# Patient Record
Sex: Male | Born: 2000 | Hispanic: Yes | Marital: Single
Health system: Southern US, Community
[De-identification: ages and names within clinical notes are randomized; demographics above are authoritative.]

---

## 2021-06-07 ENCOUNTER — Emergency Department (HOSPITAL_COMMUNITY): Payer: No Typology Code available for payment source | Admitting: Registered Nurse

## 2021-06-07 ENCOUNTER — Encounter (HOSPITAL_COMMUNITY)
Admission: EM | Disposition: A | Discharge status: Home / Self Care | Payer: Self-pay | Source: Home / Self Care | Attending: Student

## 2021-06-07 ENCOUNTER — Ambulatory Visit (HOSPITAL_COMMUNITY)
Admission: EM | Admit: 2021-06-07 | Discharge: 2021-06-08 | Disposition: A | Discharge status: Home / Self Care | Payer: No Typology Code available for payment source | Attending: Student | Admitting: Student

## 2021-06-07 ENCOUNTER — Emergency Department (HOSPITAL_COMMUNITY): Payer: No Typology Code available for payment source

## 2021-06-07 ENCOUNTER — Encounter (HOSPITAL_COMMUNITY): Payer: Self-pay | Admitting: Registered Nurse

## 2021-06-07 DIAGNOSIS — Z23 Encounter for immunization: Secondary | ICD-10-CM | POA: Diagnosis not present

## 2021-06-07 DIAGNOSIS — S68123A Partial traumatic metacarpophalangeal amputation of left middle finger, initial encounter: Secondary | ICD-10-CM | POA: Insufficient documentation

## 2021-06-07 DIAGNOSIS — S68125A Partial traumatic metacarpophalangeal amputation of left ring finger, initial encounter: Secondary | ICD-10-CM | POA: Insufficient documentation

## 2021-06-07 DIAGNOSIS — S61211A Laceration without foreign body of left index finger without damage to nail, initial encounter: Secondary | ICD-10-CM

## 2021-06-07 DIAGNOSIS — W319XXA Contact with unspecified machinery, initial encounter: Secondary | ICD-10-CM | POA: Insufficient documentation

## 2021-06-07 DIAGNOSIS — S68113A Complete traumatic metacarpophalangeal amputation of left middle finger, initial encounter: Secondary | ICD-10-CM

## 2021-06-07 DIAGNOSIS — Y9289 Other specified places as the place of occurrence of the external cause: Secondary | ICD-10-CM | POA: Diagnosis not present

## 2021-06-07 HISTORY — PX: AMPUTATION: SHX166

## 2021-06-07 SURGERY — AMPUTATION DIGIT
Anesthesia: General | Laterality: Left

## 2021-06-07 MED ORDER — ACETAMINOPHEN 500 MG PO TABS
ORAL_TABLET | ORAL | Status: AC
  Filled 2021-06-07: qty 2

## 2021-06-07 MED ORDER — PROMETHAZINE HCL 25 MG/ML IJ SOLN: 6.2500 mg | INTRAMUSCULAR | Status: DC | PRN

## 2021-06-07 MED ORDER — DEXAMETHASONE SODIUM PHOSPHATE 10 MG/ML IJ SOLN
INTRAMUSCULAR | Status: AC
  Filled 2021-06-07: qty 1

## 2021-06-07 MED ORDER — SODIUM CHLORIDE 0.9 % IR SOLN
Status: DC | PRN
  Administered 2021-06-07: 1000 mL
  Administered 2021-06-08: 3000 mL

## 2021-06-07 MED ORDER — LACTATED RINGERS IV SOLN: INTRAVENOUS | Status: DC | PRN

## 2021-06-07 MED ORDER — LIDOCAINE 2% (20 MG/ML) 5 ML SYRINGE
INTRAMUSCULAR | Status: DC | PRN
  Administered 2021-06-07: 40 mg via INTRAVENOUS

## 2021-06-07 MED ORDER — MIDAZOLAM HCL 5 MG/5ML IJ SOLN
INTRAMUSCULAR | Status: DC | PRN
  Administered 2021-06-07: 2 mg via INTRAVENOUS

## 2021-06-07 MED ORDER — PROPOFOL 10 MG/ML IV BOLUS
INTRAVENOUS | Status: DC | PRN
  Administered 2021-06-07: 200 mg via INTRAVENOUS

## 2021-06-07 MED ORDER — MIDAZOLAM HCL 2 MG/2ML IJ SOLN
INTRAMUSCULAR | Status: AC
  Filled 2021-06-07: qty 2

## 2021-06-07 MED ORDER — HYDROCODONE-ACETAMINOPHEN 5-325 MG PO TABS
1.0000 | ORAL_TABLET | Freq: Once | ORAL | Status: AC
  Administered 2021-06-07: 1 via ORAL
  Filled 2021-06-07: qty 1

## 2021-06-07 MED ORDER — DEXAMETHASONE SODIUM PHOSPHATE 10 MG/ML IJ SOLN
INTRAMUSCULAR | Status: DC | PRN
  Administered 2021-06-07: 5 mg via INTRAVENOUS

## 2021-06-07 MED ORDER — ACETAMINOPHEN 500 MG PO TABS
1000.0000 mg | ORAL_TABLET | Freq: Once | ORAL | Status: AC
  Administered 2021-06-07: 1000 mg via ORAL

## 2021-06-07 MED ORDER — OXYCODONE HCL 5 MG PO TABS: 5.0000 mg | ORAL_TABLET | Freq: Once | ORAL | Status: DC | PRN

## 2021-06-07 MED ORDER — FENTANYL CITRATE (PF) 100 MCG/2ML IJ SOLN
INTRAMUSCULAR | Status: AC
  Filled 2021-06-07: qty 2

## 2021-06-07 MED ORDER — OXYCODONE HCL 5 MG/5ML PO SOLN: 5.0000 mg | Freq: Once | ORAL | Status: DC | PRN

## 2021-06-07 MED ORDER — ONDANSETRON HCL 4 MG/2ML IJ SOLN
INTRAMUSCULAR | Status: AC
  Filled 2021-06-07: qty 2

## 2021-06-07 MED ORDER — FENTANYL CITRATE (PF) 100 MCG/2ML IJ SOLN
INTRAMUSCULAR | Status: DC | PRN
  Administered 2021-06-07 – 2021-06-08 (×2): 50 ug via INTRAVENOUS

## 2021-06-07 MED ORDER — BUPIVACAINE HCL 0.25 % IJ SOLN
INTRAMUSCULAR | Status: AC
  Filled 2021-06-07: qty 1

## 2021-06-07 MED ORDER — TETANUS-DIPHTHERIA TOXOIDS TD 5-2 LFU IM INJ
0.5000 mL | INJECTION | Freq: Once | INTRAMUSCULAR | Status: AC
  Administered 2021-06-07: 0.5 mL via INTRAMUSCULAR
  Filled 2021-06-07: qty 0.5

## 2021-06-07 MED ORDER — CEFAZOLIN SODIUM-DEXTROSE 2-3 GM-%(50ML) IV SOLR
INTRAVENOUS | Status: DC | PRN
  Administered 2021-06-07: 2 g via INTRAVENOUS

## 2021-06-07 MED ORDER — CEFAZOLIN SODIUM-DEXTROSE 2-4 GM/100ML-% IV SOLN
INTRAVENOUS | Status: AC
  Filled 2021-06-07: qty 100

## 2021-06-07 MED ORDER — FENTANYL CITRATE PF 50 MCG/ML IJ SOSY: 25.0000 ug | PREFILLED_SYRINGE | INTRAMUSCULAR | Status: DC | PRN

## 2021-06-07 MED ORDER — PROPOFOL 10 MG/ML IV BOLUS
INTRAVENOUS | Status: AC
  Filled 2021-06-07: qty 20

## 2021-06-07 SURGICAL SUPPLY — 24 items
BAG COUNTER SPONGE SURGICOUNT (BAG) IMPLANT
BNDG ELASTIC 3X5.8 VLCR STR LF (GAUZE/BANDAGES/DRESSINGS) ×1 IMPLANT
BNDG ESMARK 4X9 LF (GAUZE/BANDAGES/DRESSINGS) ×1 IMPLANT
BNDG GAUZE ELAST 4 BULKY (GAUZE/BANDAGES/DRESSINGS) ×1 IMPLANT
COVER SURGICAL LIGHT HANDLE (MISCELLANEOUS) ×2 IMPLANT
CUFF TOURN SGL QUICK 18 (TOURNIQUET CUFF) ×1 IMPLANT
DECANTER SPIKE VIAL GLASS SM (MISCELLANEOUS) ×1 IMPLANT
DRAPE U-SHAPE 47X51 STRL (DRAPES) ×1 IMPLANT
GAUZE SPONGE 4X4 12PLY STRL (GAUZE/BANDAGES/DRESSINGS) ×1 IMPLANT
GAUZE XEROFORM 1X8 LF (GAUZE/BANDAGES/DRESSINGS) ×1 IMPLANT
GLOVE SURG ORTHO LTX SZ8 (GLOVE) ×2 IMPLANT
GOWN SPEC L3 XXLG W/TWL (GOWN DISPOSABLE) ×3 IMPLANT
KIT BASIN OR (CUSTOM PROCEDURE TRAY) ×1 IMPLANT
MANIFOLD NEPTUNE II (INSTRUMENTS) ×2 IMPLANT
PACK ORTHO EXTREMITY (CUSTOM PROCEDURE TRAY) ×1 IMPLANT
PAD CAST 4YDX4 CTTN HI CHSV (CAST SUPPLIES) IMPLANT
PADDING CAST COTTON 4X4 STRL (CAST SUPPLIES) ×1
PENCIL SMOKE EVACUATOR (MISCELLANEOUS) IMPLANT
SPLINT PLASTER EXTRA FAST 3X15 (CAST SUPPLIES) ×1
SPLINT PLASTER GYPS XFAST 3X15 (CAST SUPPLIES) IMPLANT
SUT CHROMIC 5 0 P 3 (SUTURE) ×1 IMPLANT
SUT CHROMIC 7 0 TG140 8 (SUTURE) ×1 IMPLANT
SUT MON AB 5-0 PS2 18 (SUTURE) ×1 IMPLANT
SYR CONTROL 10ML LL (SYRINGE) ×1 IMPLANT

## 2021-06-07 NOTE — Progress Notes (Signed)
Using AMN interpreting service : Imelda Pillow # [phone]

## 2021-06-07 NOTE — ED Notes (Signed)
X-ray at bedside

## 2021-06-07 NOTE — ED Provider Notes (Signed)
[facility]-EMERGENCY DEPT Provider Note  CSN: [id number] Arrival date & time: 06/07/21 1747  Chief Complaint(s) Laceration  HPI Gary Pittman is a 21 y.o. male who presents emergency department for evaluation of a left hand injury.  Patient was using a paper grinder with rotating metal blades and got his left hand stuck in the machine causing a traumatic amputation of digits 2 and 3 on the left.  He arrives with active oozing but no arterial bleeding.  Denies numbness, tingling, weakness of the hands.  Range of motion limited by pain.  Pulses intact.  Unsure of tetanus vaccination status.   Laceration  Past Medical History No past medical history on file. There are no problems to display for this patient.  Home Medication(s) Prior to Admission medications   Not on File                                                                                                                                    Past Surgical History  Family History No family history on file.  Social History   Allergies Patient has no known allergies.  Review of Systems Review of Systems  Skin:  Positive for wound.   Physical Exam Vital Signs  I have reviewed the triage vital signs BP 131/72    Pulse 73    Temp 98 F (36.7 C)    Resp 18    Wt 60 kg    SpO2 100%   Physical Exam Vitals and nursing note reviewed.  Constitutional:      General: He is not in acute distress.    Appearance: He is well-developed.  HENT:     Head: Normocephalic and atraumatic.  Eyes:     Conjunctiva/sclera: Conjunctivae normal.  Cardiovascular:     Rate and Rhythm: Normal rate and regular rhythm.     Heart sounds: No murmur heard. Pulmonary:     Effort: Pulmonary effort is normal. No respiratory distress.     Breath sounds: Normal breath sounds.  Abdominal:     Palpations: Abdomen is soft.     Tenderness: There is no abdominal tenderness.  Musculoskeletal:        General: Tenderness,  deformity and signs of injury present. No swelling.     Cervical back: Neck supple.  Skin:    General: Skin is warm and dry.     Capillary Refill: Capillary refill takes less than 2 seconds.  Neurological:     Mental Status: He is alert.  Psychiatric:        Mood and Affect: Mood normal.      ED Results and Treatments Labs (all labs ordered are listed, but only abnormal results are displayed) Labs Reviewed - No data to display  Radiology DG Hand Complete Left  Result Date: 06/07/2021 CLINICAL DATA:  Laceration EXAM: LEFT HAND - COMPLETE 3+ VIEW COMPARISON:  None. FINDINGS: Soft tissue and bony amputation of the third digit involving the tuft of the distal phalanx. No radiopaque foreign body IMPRESSION: Soft tissue and bony amputation of the third distal digit involves the tuft of the distal phalanx Electronically Signed   By: Jasmine Pang M.D.   On: 06/07/2021 18:28    Pertinent labs & imaging results that were available during my care of the patient were reviewed by me and considered in my medical decision making (see MDM for details).  Medications Ordered in ED Medications  HYDROcodone-acetaminophen (NORCO/VICODIN) 5-325 MG per tablet 1 tablet (1 tablet Oral Given 06/07/21 1803)  tetanus & diphtheria toxoids (adult) (TENIVAC) injection 0.5 mL (0.5 mLs Intramuscular Given 06/07/21 1915)                                                                                                                                     Procedures Procedures  (including critical care time)  Medical Decision Making / ED Course   This patient presents to the ED for concern of finger injury, this involves an extensive number of treatment options, and is a complaint that carries with it a high risk of complications and morbidity.  The differential diagnosis includes fracture,  laceration, traumatic amputation   MDM: Patient seen the emergency department for evaluation of a finger injury.  Physical exam reveals traumatic amputation of digits 2 and 3 distal tip around the tuft on the left hand.  Bleeding controlled with finger tourniquets and ultimately with quick clot and compressive bandages.  X-rays showing amputation of the distal tuft.  Hand surgery was consulted who will take the patient to the operating room tonight for repair.  Tetanus updated here in the emergency department.   Additional history obtained: -Additional history obtained from work Merchandiser, retail -External records from outside source obtained and reviewed including: Chart review including previous notes, labs, imaging, consultation notes   Lab Tests: -I ordered, reviewed, and interpreted labs.   The pertinent results include:   Labs Reviewed - No data to display     Imaging Studies ordered: I ordered imaging studies including XR hand I independently visualized and interpreted imaging. I agree with the radiologist interpretation   Medicines ordered and prescription drug management: Meds ordered this encounter  Medications   HYDROcodone-acetaminophen (NORCO/VICODIN) 5-325 MG per tablet 1 tablet   tetanus & diphtheria toxoids (adult) (TENIVAC) injection 0.5 mL    -I have reviewed the patients home medicines and have made adjustments as needed  Critical interventions none  Consultations Obtained: I requested consultation with the hand surgery,  and discussed lab and imaging findings as well as pertinent plan - they recommend: NPO, take to OR tonight   Cardiac Monitoring: The patient was maintained on a cardiac monitor.  I personally viewed and  interpreted the cardiac monitored which showed an underlying rhythm of: NSR  Social Determinants of Health:  Factors impacting patients care include: non-english speaking   Reevaluation: After the interventions noted above, I reevaluated the  patient and found that they have :improved  Co morbidities that complicate the patient evaluation No past medical history on file.    Dispostion: I considered admission for this patient, and he will go straight from the emergency department to the operating room for operative repair with hand surgery     Final Clinical Impression(s) / ED Diagnoses Final diagnoses:  Laceration of left index finger without foreign body, nail damage status unspecified, initial encounter     @PCDICTATION @    Glendora ScoreKommor, Odette Watanabe, MD 06/07/21 1955

## 2021-06-07 NOTE — ED Triage Notes (Signed)
Pt was at work and cut 2 fingers on left hand with a "chopper" machine. Tetanus not up to date. Pt bleeding but controlled

## 2021-06-07 NOTE — Progress Notes (Signed)
1 g tylenol po given with sips of water.

## 2021-06-07 NOTE — Discharge Instructions (Addendum)

## 2021-06-07 NOTE — Progress Notes (Signed)
D; pt tx to PACU for pre-op via wheelchair. No IV site. Family are at waiting area per ER staff.

## 2021-06-07 NOTE — ED Notes (Signed)
MD at bedside during triage, pt unwrapped bandage. Injury to left right and left middle finger. Bleeding to tips of both fingers. Md applied finger tourniquets to both to help control bleeding. Wet gauze applied. Pain medication given. MD advised pt that hand surgeon may be needed due to extent of injury.

## 2021-06-07 NOTE — Anesthesia Preprocedure Evaluation (Addendum)
Anesthesia Evaluation  Patient identified by MRN, date of birth, ID band Patient awake    Reviewed: Allergy & Precautions, NPO status , Patient's Chart, lab work & pertinent test results  History of Anesthesia Complications Negative for: history of anesthetic complications  Airway Mallampati: I  TM Distance: >3 FB Neck ROM: Full    Dental  (+) Dental Advisory Given, Chipped   Pulmonary neg pulmonary ROS,    Pulmonary exam normal        Cardiovascular negative cardio ROS Normal cardiovascular exam     Neuro/Psych negative neurological ROS  negative psych ROS   GI/Hepatic negative GI ROS, Neg liver ROS,   Endo/Other  negative endocrine ROS  Renal/GU negative Renal ROS     Musculoskeletal negative musculoskeletal ROS (+)   Abdominal   Peds  Hematology negative hematology ROS (+)   Anesthesia Other Findings   Reproductive/Obstetrics                            Anesthesia Physical Anesthesia Plan  ASA: 1  Anesthesia Plan: General   Post-op Pain Management: Regional block and Minimal or no pain anticipated   Induction: Intravenous  PONV Risk Score and Plan: 2 and Treatment may vary due to age or medical condition, Ondansetron, Dexamethasone and Midazolam  Airway Management Planned: LMA  Additional Equipment: None  Intra-op Plan:   Post-operative Plan: Extubation in OR  Informed Consent: I have reviewed the patients History and Physical, chart, labs and discussed the procedure including the risks, benefits and alternatives for the proposed anesthesia with the patient or authorized representative who has indicated his/her understanding and acceptance.     Dental advisory given and Interpreter used for interveiw  Plan Discussed with: CRNA and Anesthesiologist  Anesthesia Plan Comments:        Anesthesia Quick Evaluation

## 2021-06-07 NOTE — H&P (Signed)
Gary Pittman is an 21 y.o. male.   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  He is not able to describe the machine but states it has a blade.  He reports no previous injury to the left hand and no other injury at this time.  A Spanish language video interpreter is used for the visit.  Xrays viewed and interpreted by me: ap lateral oblique views left hand show amputation of left long finger through distal phalanx. Labs reviewed: none  Allergies: No Known Allergies  History reviewed. No pertinent past medical history.  History reviewed. No pertinent surgical history.  Family History: History reviewed. No pertinent family history.  Social History:   has no history on file for tobacco use, alcohol use, and drug use.  Medications: No medications prior to admission.    No results found for this or any previous visit (from the past 48 hour(s)).  DG Hand Complete Left  Result Date: 06/07/2021 CLINICAL DATA:  Laceration EXAM: LEFT HAND - COMPLETE 3+ VIEW COMPARISON:  None. FINDINGS: Soft tissue and bony amputation of the third digit involving the tuft of the distal phalanx. No radiopaque foreign body IMPRESSION: Soft tissue and bony amputation of the third distal digit involves the tuft of the distal phalanx Electronically Signed   By: Jasmine Pang M.D.   On: 06/07/2021 18:28       Blood pressure 125/76, pulse 61, temperature 97.9 F (36.6 C), resp. rate 19, weight 60 kg, SpO2 100 %.  General appearance: alert, cooperative, and appears stated age Head: Normocephalic, without obvious abnormality, atraumatic Neck: supple, symmetrical, trachea midline Extremities: Intact sensation and capillary refill all digits.  +epl/fpl/io.  Amputation left long and ring fingertips at distal phalanx. Pulses: 2+ and symmetric Skin: Skin color, texture, turgor normal. No rashes or lesions Neurologic:  Grossly normal Incision/Wound: as above  Assessment/Plan Left long and ring fingertip amputations.  Recommend revision amputation in OR.  Risks, benefits and alternatives of surgery were discussed including risks of blood loss, infection, damage to nerves/vessels/tendons/ligament/bone, failure of surgery, need for additional surgery, complication with wound healing, stiffness.  He voiced understanding of these risks and elected to proceed.    Betha Loa 06/07/2021, 11:42 PM

## 2021-06-07 NOTE — ED Notes (Signed)
Pt to go to PACU at 1030 to prepare for surgery.

## 2021-06-08 ENCOUNTER — Encounter (HOSPITAL_COMMUNITY): Payer: Self-pay | Admitting: Orthopedic Surgery

## 2021-06-08 MED ORDER — ONDANSETRON HCL 4 MG/2ML IJ SOLN
INTRAMUSCULAR | Status: DC | PRN
Start: 2021-06-08 — End: 2021-06-08
  Administered 2021-06-08: 4 mg via INTRAVENOUS

## 2021-06-08 MED ORDER — HYDROCODONE-ACETAMINOPHEN 5-325 MG PO TABS: ORAL_TABLET | ORAL | 0 refills | Status: AC

## 2021-06-08 MED ORDER — SULFAMETHOXAZOLE-TRIMETHOPRIM 800-160 MG PO TABS: 1.0000 | ORAL_TABLET | Freq: Two times a day (BID) | ORAL | 0 refills | Status: AC

## 2021-06-08 MED ORDER — BUPIVACAINE HCL (PF) 0.25 % IJ SOLN
INTRAMUSCULAR | Status: DC | PRN
  Administered 2021-06-08: 50 mL

## 2021-06-08 NOTE — Anesthesia Procedure Notes (Addendum)
Procedure Name: LMA Insertion Date/Time: 06/08/2021 11:47 PM Performed by: Jhonnie Garner, CRNA Pre-anesthesia Checklist: Patient identified, Emergency Drugs available, Suction available and Patient being monitored Patient Re-evaluated:Patient Re-evaluated prior to induction Oxygen Delivery Method: Circle system utilized Preoxygenation: Pre-oxygenation with 100% oxygen Induction Type: IV induction LMA: LMA inserted LMA Size: 4.0 Number of attempts: 1 Placement Confirmation: positive ETCO2 and breath sounds checked- equal and bilateral Tube secured with: Tape Dental Injury: Teeth and Oropharynx as per pre-operative assessment

## 2021-06-08 NOTE — Anesthesia Postprocedure Evaluation (Signed)
Anesthesia Post Note  Patient: Gary Pittman  Procedure(s) Performed: REVISION AMPUTATION LEFT LONG FINGER AND  NAIL BED GRAFT OF RING FINGER (Left)     Patient location during evaluation: PACU Anesthesia Type: General Level of consciousness: awake and alert Pain management: pain level controlled Vital Signs Assessment: post-procedure vital signs reviewed and stable Respiratory status: spontaneous breathing, nonlabored ventilation and respiratory function stable Cardiovascular status: stable and blood pressure returned to baseline Anesthetic complications: no   No notable events documented.  Last Vitals:  Vitals:   06/08/21 0106 06/08/21 0107  BP: 118/65 118/65  Pulse:    Resp: 16 12  Temp:    SpO2:      Last Pain:  Vitals:   06/07/21 1917  PainSc: 6                  Beryle Lathe

## 2021-06-08 NOTE — Progress Notes (Signed)
AMN interpreting system used Debbra Riding # [phone]

## 2021-06-08 NOTE — Anesthesia Procedure Notes (Addendum)
Procedure Name: LMA Insertion Date/Time: 06/07/2021 11:47 PM Performed by: Jhonnie Garner, CRNA Pre-anesthesia Checklist: Patient identified, Emergency Drugs available, Suction available and Patient being monitored Patient Re-evaluated:Patient Re-evaluated prior to induction Oxygen Delivery Method: Circle system utilized Preoxygenation: Pre-oxygenation with 100% oxygen Induction Type: IV induction LMA: LMA inserted LMA Size: 4.0 Number of attempts: 1 Placement Confirmation: positive ETCO2 and breath sounds checked- equal and bilateral Tube secured with: Tape Dental Injury: Teeth and Oropharynx as per pre-operative assessment

## 2021-06-08 NOTE — Op Note (Signed)
NAME: Gary Pittman MEDICAL RECORD NO: [id number] DATE OF BIRTH: 04/12/2001 FACILITY: [facility] LOCATION: [facility] PHYSICIAN: Tami Ribas, MD   OPERATIVE REPORT   DATE OF PROCEDURE: 06/08/21    PREOPERATIVE DIAGNOSIS: Left long and ring fingertip amputations   POSTOPERATIVE DIAGNOSIS: Left long and ring fingertip amputations   PROCEDURE: 1.  Left long finger revision amputation 2.  Left ring finger split nailbed graft, 4 x 3 mm   SURGEON:  Betha Loa, M.D.   ASSISTANT: none   ANESTHESIA:  General   INTRAVENOUS FLUIDS:  Per anesthesia flow sheet.   ESTIMATED BLOOD LOSS:  Minimal.   COMPLICATIONS:  None.   SPECIMENS:  none   TOURNIQUET TIME:   Left arm: 44 minutes at 250 mmHg   DISPOSITION:  Stable to PACU.   INDICATIONS: 21 year old right-hand-dominant male states he got the tips of his left long and ring fingertips off at work on June 07, 2021.  He was brought to [facility] long emergency department.  Radiographs revealed amputation of the long finger through the distal phalanx.  I recommended revision amputations in the operating room.  Risks, benefits and alternatives of surgery were discussed including the risks of blood loss, infection, damage to nerves, vessels, tendons, ligaments, bone for surgery, need for additional surgery, complications with wound healing, continued pain, stiffness.  He voiced understanding of these risks and elected to proceed.  OPERATIVE COURSE:  After being identified preoperatively by myself,  the patient and I agreed on the procedure and site of the procedure.  The surgical site was marked.  Surgical consent had been signed. He was given IV antibiotics as preoperative antibiotic prophylaxis. He was transferred to the operating room and placed on the operating table in supine position with the Left upper extremity on an arm board.  General anesthesia was induced by the anesthesiologist.  Left upper extremity was prepped and draped in normal  sterile orthopedic fashion.  A surgical pause was performed between the surgeons, anesthesia, and operating room staff and all were in agreement as to the patient, procedure, and site of procedure.  Tourniquet at the proximal aspect of the extremity was inflated to 250 mmHg after exsanguination of the arm with an Esmarch bandage.  Digital block was performed with quarter percent plain Marcaine to both the long and ring fingers.  The wounds were explored.  There is no gross contamination.  The long finger had amputation through the tuft of the distal phalanx.  The ring finger had amputation of the dorsal radial aspect of the finger through the nailbed.  There was a small amount of bone exposed underneath with small amount of bone missing.  The remaining fingernail was removed from both the long and ring finger with a freer elevator.  The wounds were copiously irrigated with sterile saline by cystoscopy tubing.  A knife was used to free up the soft tissues surrounding the tuft of the distal phalanx of the long finger.  The rongeurs were used to take the phalanx back to the point that soft tissues could cover over.  A 5-0 chromic suture was used to reapproximate the volar subcutaneous tissues to the dorsal nailbed.  The dogears were removed and 5-0 Monocryl suture used to reapproximate the skin edges.  Good contour was obtained with good coverage over the end of the bone.  In the ring finger it was felt that the defect was small enough that a split nailbed graft would be able to provide coverage and allow maintenance  of length of the finger and good cosmetic result.  A split nailbed graft was taken from the remaining nail toward the ulnar side of the digit.  This was 4 x 3 mm.  This was able to be placed over the defect.  A 5-0 and 7-0 chromic suture were used to reapproximate the edges of the nailbed graft to the remaining nailbed and to the subcutaneous tissues distally.  Some of the subcutaneous tissue was able to  be rolled over the tip of the tuft to provide coverage.  Good coverage of the bone was obtained.  Xeroform was placed into the nail fold and the wounds dressed with sterile Xeroform and 4 x 4's and wrapped with a Kerlix bandage.  A volar splint was placed including the long ring and small fingers with the MPs flexed and the IP is extended.  This was wrapped with Kerlix and Ace bandage.  The tourniquet was deflated at 44 minutes.  Fingertips were pink with brisk capillary refill after deflation of tourniquet.  The operative  drapes were broken down.  The patient was awoken from anesthesia safely.  He was transferred back to the stretcher and taken to PACU in stable condition.  I will see him back in the office in 1 week for postoperative followup.  I will give him a prescription for Norco 5/325 1-2 tabs PO q6 hours prn pain, dispense # 20 and Bactrim DS 1 p.o. twice daily x7 days.   Betha Loa, MD Electronically signed, 06/08/21

## 2021-06-08 NOTE — Transfer of Care (Signed)
Immediate Anesthesia Transfer of Care Note  Patient: Skylier [employer]  Procedure(s) Performed: REVISION AMPUTATION LEFT LONG AND RING FINGER (Left)  Patient Location: PACU  Anesthesia Type:General  Level of Consciousness: sedated  Airway & Oxygen Therapy: Patient Spontanous Breathing and Patient connected to face mask oxygen  Post-op Assessment: Report given to RN and Post -op Vital signs reviewed and stable  Post vital signs: Reviewed and stable  Last Vitals:  Vitals Value Taken Time  BP 118/65 06/08/21 0107  Temp    Pulse    Resp 16 06/08/21 0108  SpO2    Vitals shown include unvalidated device data.  Last Pain:  Vitals:   06/07/21 1917  PainSc: 6          Complications: No notable events documented.

## 2022-12-29 IMAGING — DX DG HAND COMPLETE 3+V*L*
3 series · 3 of 3 positions shown · non-contrast
Comparison: None.

CLINICAL DATA: Laceration

EXAM:
LEFT HAND - COMPLETE 3+ VIEW

[hand ap]
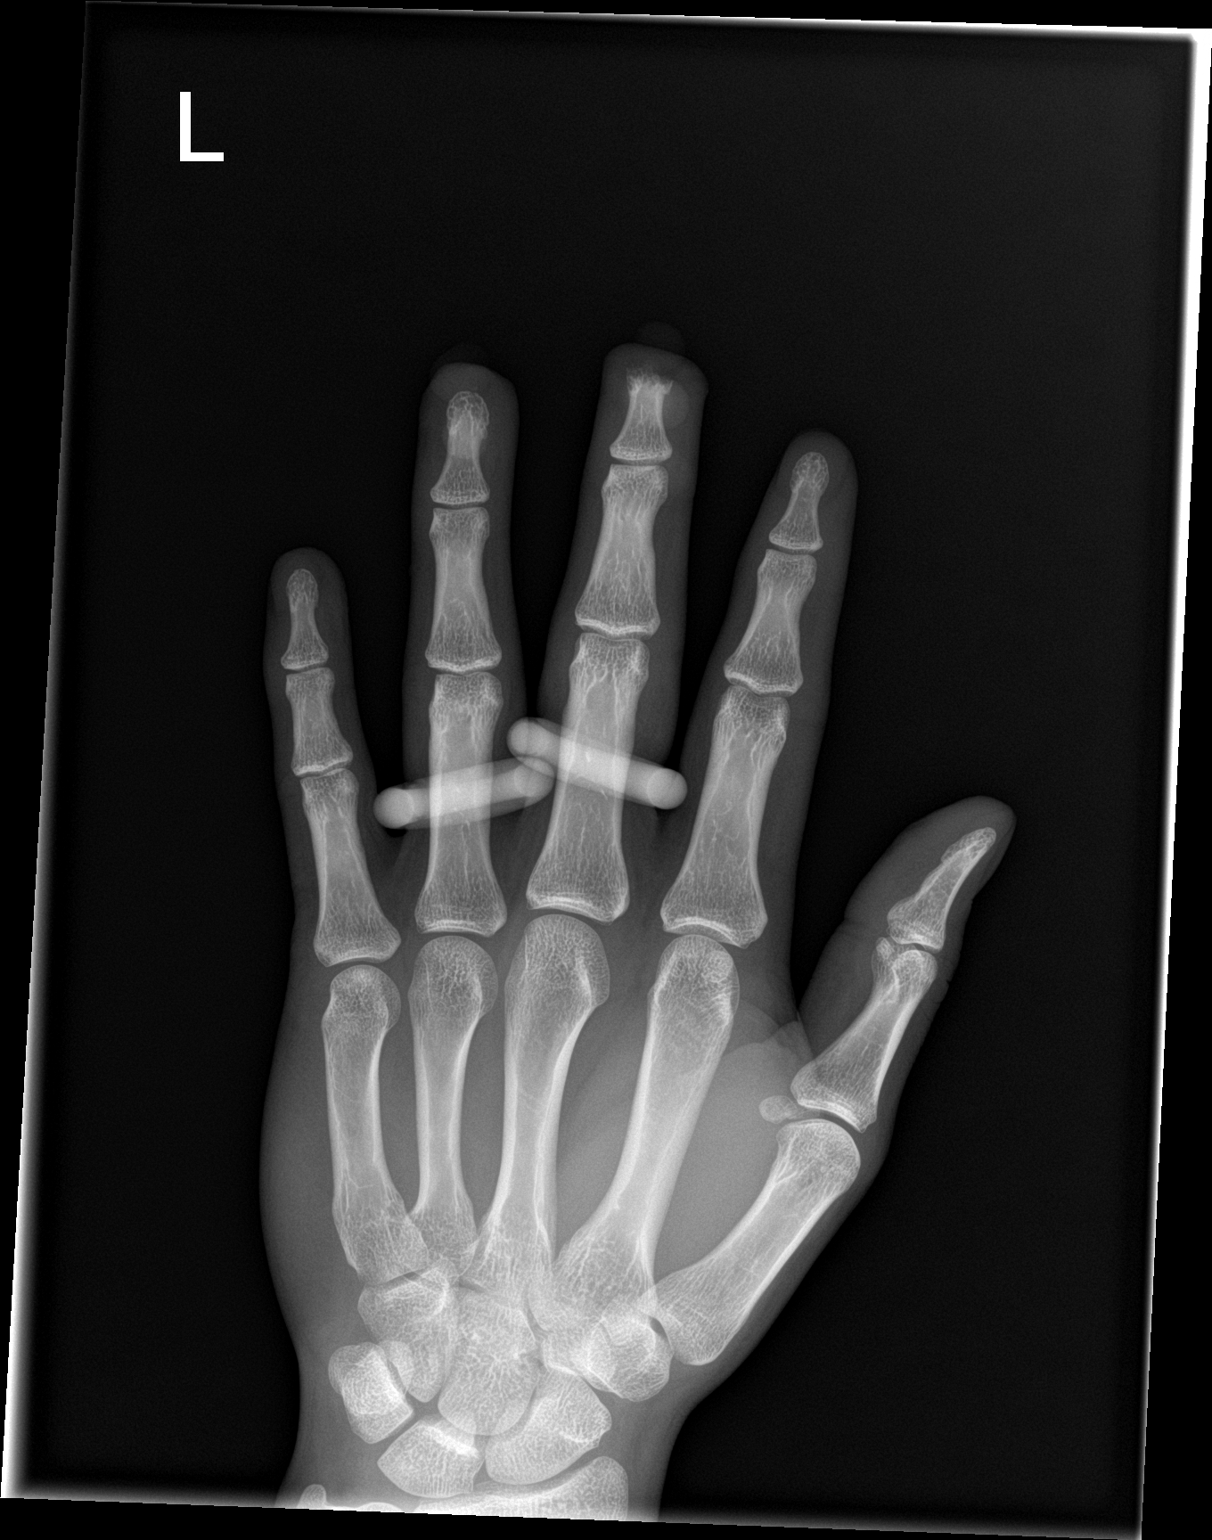

[hand obl]
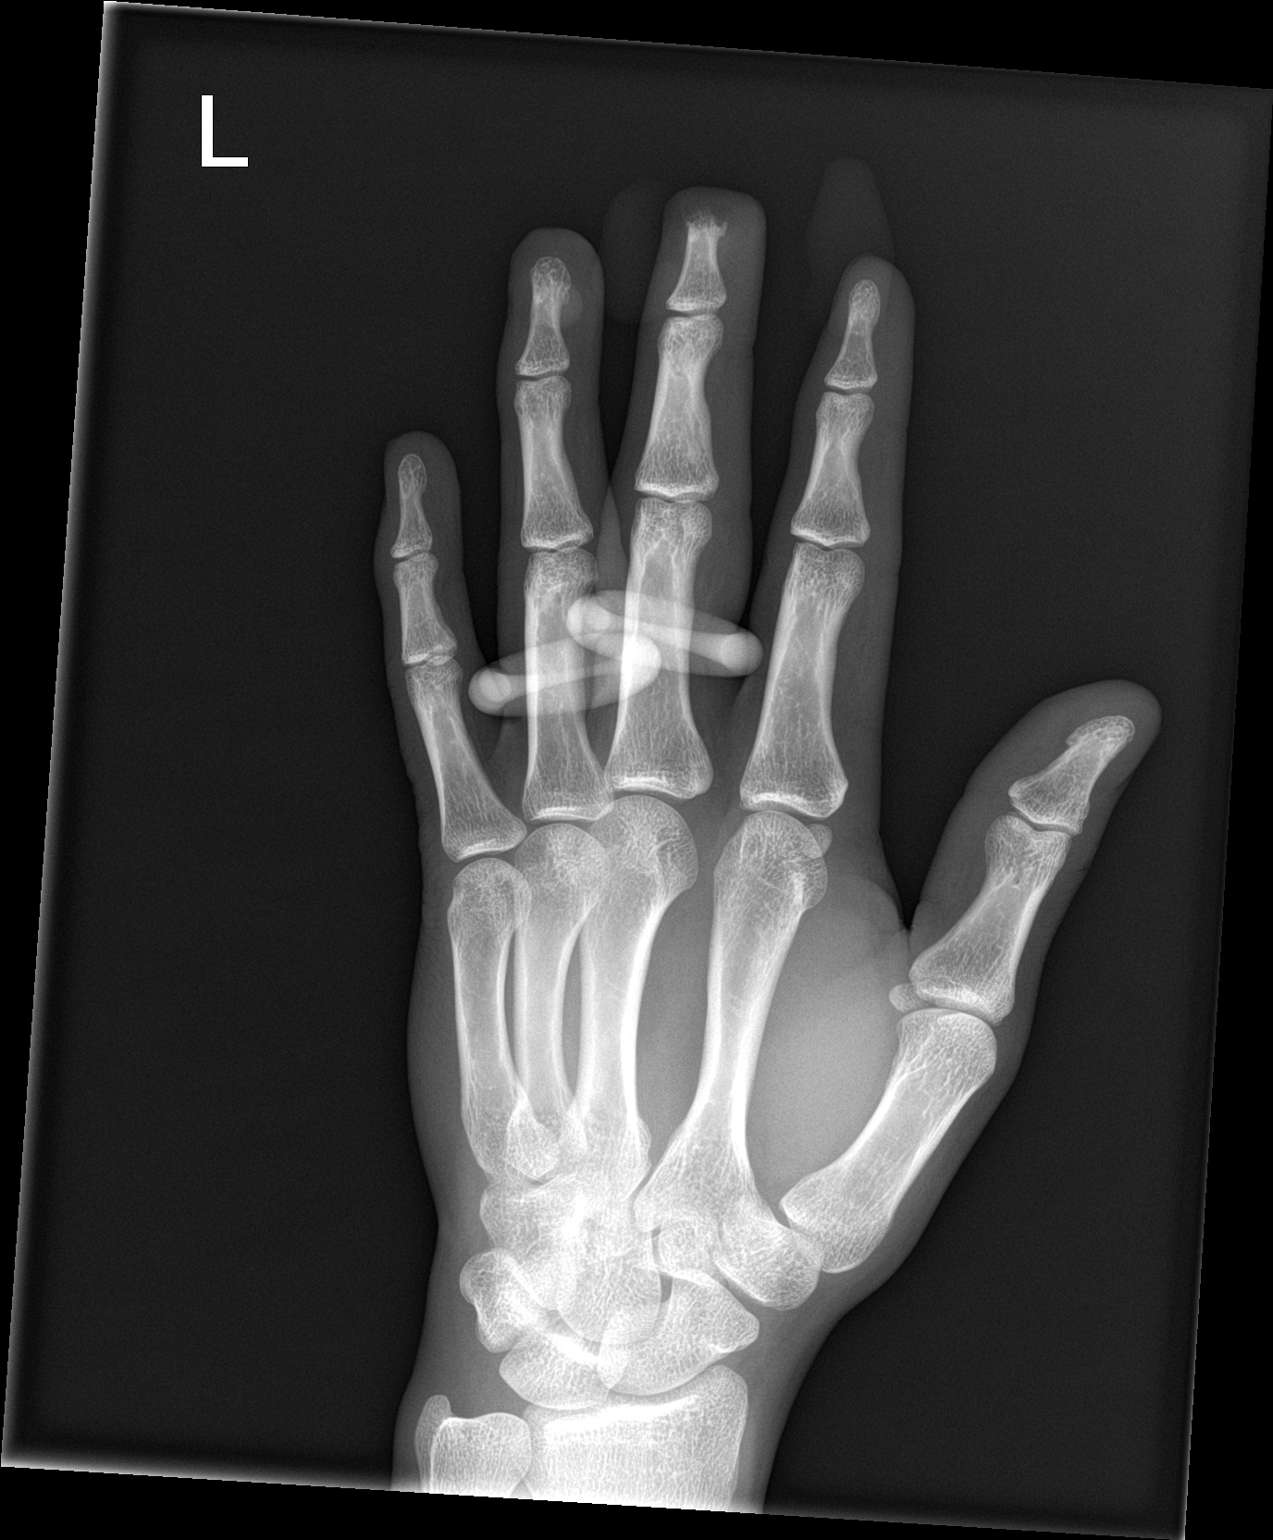

[hand lat]
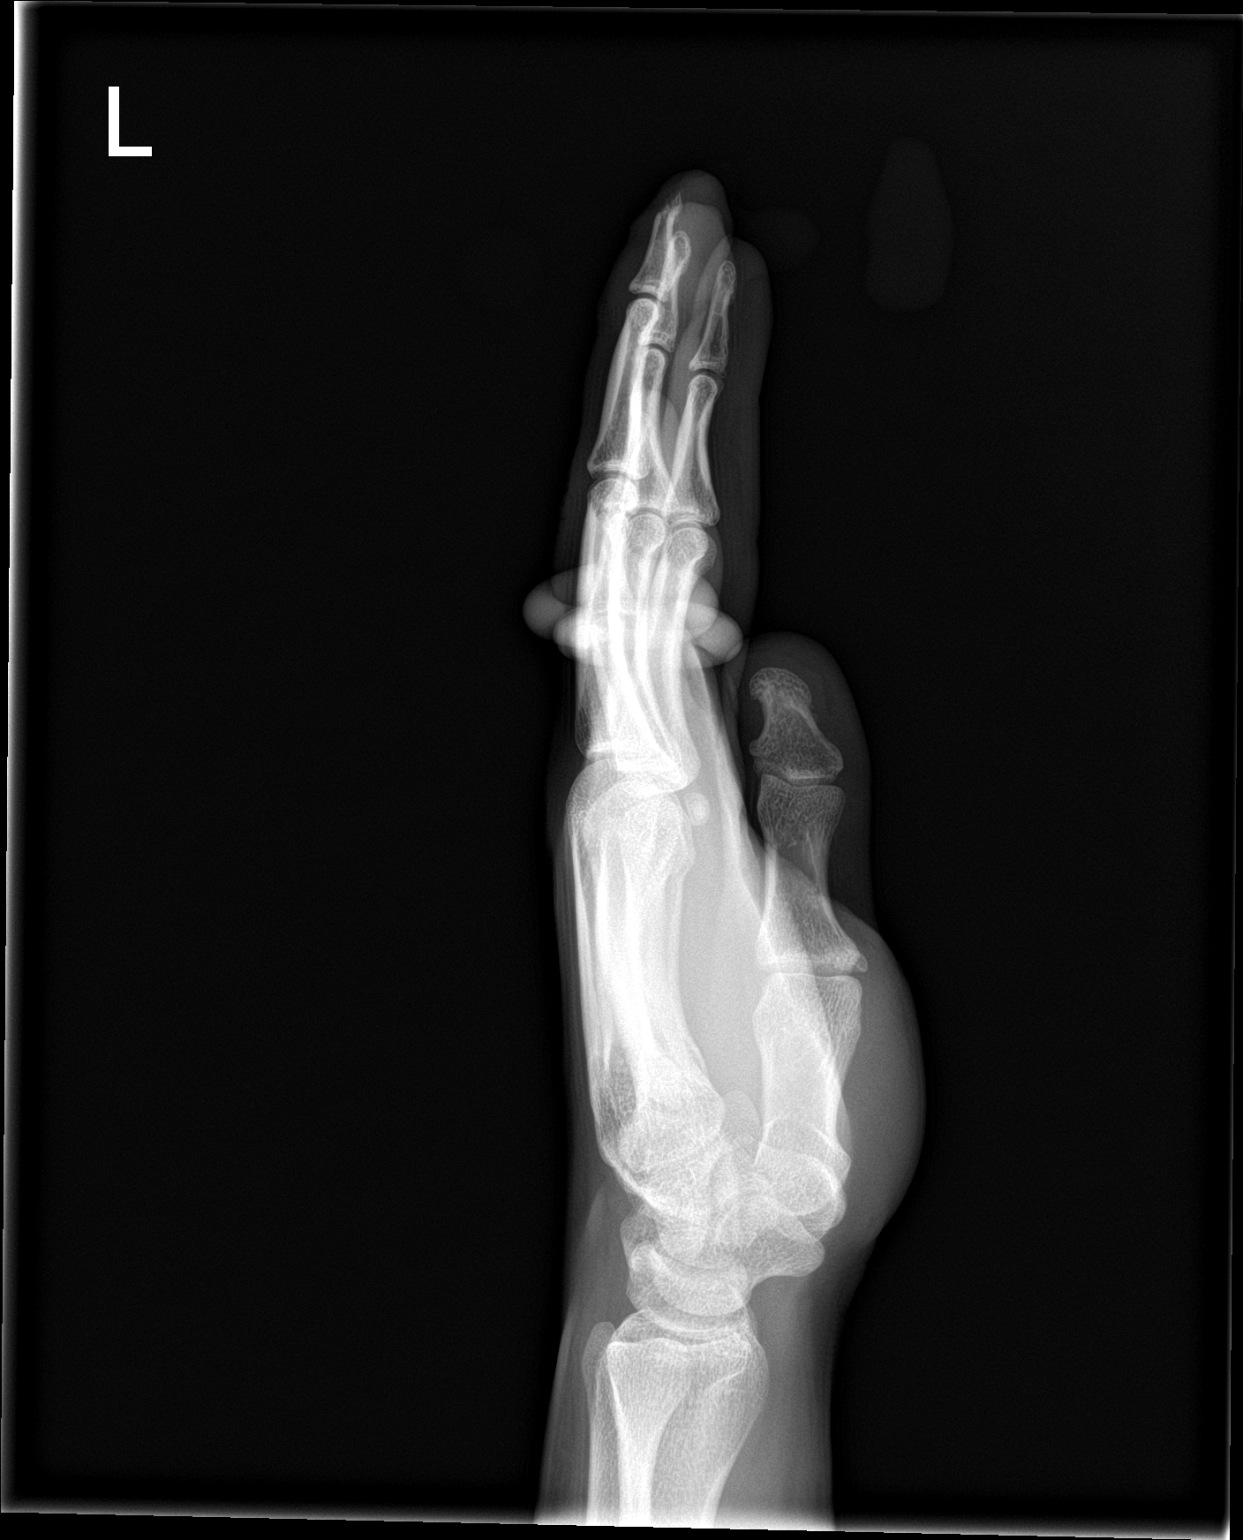

[3 of 3 positions shown; findings below may reference images not displayed]

FINDINGS: Soft tissue and bony amputation of the third digit involving the
tuft of the distal phalanx. No radiopaque foreign body
IMPRESSION: Soft tissue and bony amputation of the third distal digit involves
the tuft of the distal phalanx
